# Patient Record
Sex: Male | Born: 1935 | Race: White | Hispanic: No
Health system: Southern US, Community
[De-identification: ages and names within clinical notes are randomized; demographics above are authoritative.]

---

## 2009-09-30 ENCOUNTER — Inpatient Hospital Stay (HOSPITAL_COMMUNITY): Admission: EM | Admit: 2009-09-30 | Discharge: 2009-10-12 | Payer: Self-pay | Admitting: Emergency Medicine

## 2009-10-03 ENCOUNTER — Encounter (INDEPENDENT_AMBULATORY_CARE_PROVIDER_SITE_OTHER): Payer: Self-pay | Admitting: Internal Medicine

## 2009-10-05 ENCOUNTER — Encounter: Payer: Self-pay | Admitting: Cardiology

## 2009-10-17 ENCOUNTER — Inpatient Hospital Stay (HOSPITAL_COMMUNITY): Admission: EM | Admit: 2009-10-17 | Discharge: 2009-10-23 | Payer: Self-pay | Admitting: Emergency Medicine

## 2009-10-17 ENCOUNTER — Ambulatory Visit: Payer: Self-pay | Admitting: Thoracic Surgery (Cardiothoracic Vascular Surgery)

## 2009-10-27 ENCOUNTER — Ambulatory Visit: Payer: Self-pay | Admitting: Thoracic Surgery (Cardiothoracic Vascular Surgery)

## 2009-11-13 ENCOUNTER — Encounter
Admission: RE | Admit: 2009-11-13 | Discharge: 2009-11-13 | Payer: Self-pay | Admitting: Thoracic Surgery (Cardiothoracic Vascular Surgery)

## 2009-11-13 ENCOUNTER — Ambulatory Visit: Payer: Self-pay | Admitting: Thoracic Surgery (Cardiothoracic Vascular Surgery)

## 2010-06-16 DEATH — deceased

## 2010-10-07 ENCOUNTER — Encounter: Payer: Self-pay | Admitting: Thoracic Surgery (Cardiothoracic Vascular Surgery)

## 2010-12-02 LAB — BASIC METABOLIC PANEL
CO2: 25 mEq/L (ref 19–32)
Chloride: 102 mEq/L (ref 96–112)
Creatinine, Ser: 1.32 mg/dL (ref 0.4–1.5)
GFR calc Af Amer: >60 mL/min (ref >60)
Potassium: 3.3 mEq/L — ABNORMAL LOW (ref 3.5–5.1)

## 2010-12-02 LAB — CARDIAC PANEL(CRET KIN+CKTOT+MB+TROPI)
CK, MB: 6.2 ng/mL (ref 0.3–4.0)
CK, MB: 8 ng/mL (ref 0.3–4.0)
Relative Index: 3.4 — ABNORMAL HIGH (ref 0.0–2.5)
Total CK: 226 U/L (ref 7–232)

## 2010-12-02 LAB — DIFFERENTIAL
Basophils Relative: 0 % (ref 0–1)
Eosinophils Absolute: 0.1 10*3/uL (ref 0.0–0.7)
Eosinophils Relative: 1 % (ref 0–5)
Monocytes Absolute: 0.6 10*3/uL (ref 0.1–1.0)
Monocytes Relative: 10 % (ref 3–12)
Neutrophils Relative %: 70 % (ref 43–77)

## 2010-12-02 LAB — URINALYSIS, ROUTINE W REFLEX MICROSCOPIC
Glucose, UA: NEGATIVE mg/dL
Hgb urine dipstick: NEGATIVE
Ketones, ur: NEGATIVE mg/dL
Protein, ur: NEGATIVE mg/dL

## 2010-12-02 LAB — CBC
HCT: 42 % (ref 39.0–52.0)
MCHC: 33.9 g/dL (ref 30.0–36.0)
MCV: 96.5 fL (ref 78.0–100.0)
RBC: 4.35 MIL/uL (ref 4.22–5.81)

## 2010-12-02 LAB — POCT CARDIAC MARKERS

## 2010-12-03 LAB — BASIC METABOLIC PANEL
BUN: 17 mg/dL (ref 6–23)
BUN: 21 mg/dL (ref 6–23)
BUN: 26 mg/dL — ABNORMAL HIGH (ref 6–23)
BUN: 14 mg/dL (ref 6–23)
BUN: 14 mg/dL (ref 6–23)
BUN: 15 mg/dL (ref 6–23)
BUN: 19 mg/dL (ref 6–23)
CO2: 28 mEq/L (ref 19–32)
CO2: 28 mEq/L (ref 19–32)
CO2: 26 mEq/L (ref 19–32)
CO2: 29 mEq/L (ref 19–32)
Calcium: 8.7 mg/dL (ref 8.4–10.5)
Calcium: 9 mg/dL (ref 8.4–10.5)
Calcium: 8.7 mg/dL (ref 8.4–10.5)
Calcium: 8.9 mg/dL (ref 8.4–10.5)
Calcium: 8.9 mg/dL (ref 8.4–10.5)
Calcium: 9.5 mg/dL (ref 8.4–10.5)
Chloride: 103 mEq/L (ref 96–112)
Chloride: 102 mEq/L (ref 96–112)
Chloride: 105 mEq/L (ref 96–112)
Chloride: 109 mEq/L (ref 96–112)
Creatinine, Ser: 1.19 mg/dL (ref 0.4–1.5)
Creatinine, Ser: 0.79 mg/dL (ref 0.4–1.5)
Creatinine, Ser: 0.84 mg/dL (ref 0.4–1.5)
Creatinine, Ser: 0.98 mg/dL (ref 0.4–1.5)
Creatinine, Ser: 1.01 mg/dL (ref 0.4–1.5)
Creatinine, Ser: 1.09 mg/dL (ref 0.4–1.5)
Creatinine, Ser: 1.12 mg/dL (ref 0.4–1.5)
GFR calc Af Amer: >60 mL/min (ref >60)
GFR calc Af Amer: >60 mL/min (ref >60)
GFR calc Af Amer: >60 mL/min (ref >60)
GFR calc Af Amer: >60 mL/min (ref >60)
GFR calc non Af Amer: 42 mL/min — ABNORMAL LOW (ref >60)
GFR calc non Af Amer: 58 mL/min — ABNORMAL LOW (ref >60)
GFR calc non Af Amer: >60 mL/min (ref >60)
GFR calc non Af Amer: >60 mL/min (ref >60)
GFR calc non Af Amer: >60 mL/min (ref >60)
GFR calc non Af Amer: >60 mL/min (ref >60)
Glucose, Bld: 101 mg/dL — ABNORMAL HIGH (ref 70–99)
Glucose, Bld: 101 mg/dL — ABNORMAL HIGH (ref 70–99)
Glucose, Bld: 137 mg/dL — ABNORMAL HIGH (ref 70–99)
Glucose, Bld: 116 mg/dL — ABNORMAL HIGH (ref 70–99)
Glucose, Bld: 120 mg/dL — ABNORMAL HIGH (ref 70–99)
Potassium: 3.7 mEq/L (ref 3.5–5.1)
Potassium: 3.5 mEq/L (ref 3.5–5.1)
Potassium: 3.6 mEq/L (ref 3.5–5.1)
Sodium: 140 mEq/L (ref 135–145)
Sodium: 147 mEq/L — ABNORMAL HIGH (ref 135–145)

## 2010-12-03 LAB — HEPATIC FUNCTION PANEL
ALT: 11 U/L (ref 0–53)
AST: 23 U/L (ref 0–37)
Albumin: 3.2 g/dL — ABNORMAL LOW (ref 3.5–5.2)
Bilirubin, Direct: 0.2 mg/dL (ref 0.0–0.3)
Total Bilirubin: 1.1 mg/dL (ref 0.3–1.2)

## 2010-12-03 LAB — CBC
HCT: 34.6 % — ABNORMAL LOW (ref 39.0–52.0)
HCT: 35 % — ABNORMAL LOW (ref 39.0–52.0)
MCHC: 33.8 g/dL (ref 30.0–36.0)
MCHC: 34.3 g/dL (ref 30.0–36.0)
MCHC: 34.8 g/dL (ref 30.0–36.0)
MCHC: 35 g/dL (ref 30.0–36.0)
MCV: 97.3 fL (ref 78.0–100.0)
MCV: 96.9 fL (ref 78.0–100.0)
MCV: 97.8 fL (ref 78.0–100.0)
Platelets: 117 10*3/uL — ABNORMAL LOW (ref 150–400)
Platelets: 116 10*3/uL — ABNORMAL LOW (ref 150–400)
Platelets: 118 10*3/uL — ABNORMAL LOW (ref 150–400)
Platelets: 200 10*3/uL (ref 150–400)
RBC: 3.91 MIL/uL — ABNORMAL LOW (ref 4.22–5.81)
RDW: 13.1 % (ref 11.5–15.5)
RDW: 12.7 % (ref 11.5–15.5)
RDW: 12.9 % (ref 11.5–15.5)
WBC: 6.7 10*3/uL (ref 4.0–10.5)
WBC: 6.8 10*3/uL (ref 4.0–10.5)
WBC: 9.1 10*3/uL (ref 4.0–10.5)

## 2010-12-03 LAB — URINALYSIS, ROUTINE W REFLEX MICROSCOPIC
Bilirubin Urine: NEGATIVE
Glucose, UA: NEGATIVE mg/dL
Specific Gravity, Urine: 1.02 (ref 1.005–1.030)

## 2010-12-03 LAB — PROTIME-INR: INR: 1.09 (ref 0.00–1.49)

## 2010-12-03 LAB — URINE MICROSCOPIC-ADD ON

## 2010-12-03 LAB — EYE CULTURE

## 2010-12-03 LAB — URINE CULTURE

## 2010-12-03 LAB — CARDIAC PANEL(CRET KIN+CKTOT+MB+TROPI): Relative Index: 2.9 — ABNORMAL HIGH (ref 0.0–2.5)

## 2010-12-03 LAB — MAGNESIUM: Magnesium: 2.2 mg/dL (ref 1.5–2.5)

## 2010-12-03 LAB — CORTISOL-AM, BLOOD: Cortisol - AM: 18.2 ug/dL (ref 4.3–22.4)

## 2010-12-03 LAB — APTT: aPTT: 35 seconds (ref 24–37)

## 2010-12-06 LAB — URINALYSIS, ROUTINE W REFLEX MICROSCOPIC
Ketones, ur: 15 mg/dL — AB
Protein, ur: 30 mg/dL — AB
Urobilinogen, UA: 2 mg/dL — ABNORMAL HIGH (ref 0.0–1.0)

## 2010-12-06 LAB — BASIC METABOLIC PANEL
BUN: 17 mg/dL (ref 6–23)
CO2: 25 mEq/L (ref 19–32)
CO2: 26 mEq/L (ref 19–32)
CO2: 27 mEq/L (ref 19–32)
Calcium: 8 mg/dL — ABNORMAL LOW (ref 8.4–10.5)
Calcium: 8.2 mg/dL — ABNORMAL LOW (ref 8.4–10.5)
Calcium: 9.4 mg/dL (ref 8.4–10.5)
Chloride: 105 mEq/L (ref 96–112)
Chloride: 107 mEq/L (ref 96–112)
Creatinine, Ser: 1.03 mg/dL (ref 0.4–1.5)
Creatinine, Ser: 2.07 mg/dL — ABNORMAL HIGH (ref 0.4–1.5)
Creatinine, Ser: 2.37 mg/dL — ABNORMAL HIGH (ref 0.4–1.5)
GFR calc Af Amer: 32 mL/min — ABNORMAL LOW (ref >60)
GFR calc Af Amer: 37 mL/min — ABNORMAL LOW (ref >60)
GFR calc Af Amer: 45 mL/min — ABNORMAL LOW (ref >60)
GFR calc Af Amer: >60 mL/min (ref >60)
GFR calc non Af Amer: 37 mL/min — ABNORMAL LOW (ref >60)
GFR calc non Af Amer: 58 mL/min — ABNORMAL LOW (ref >60)
Glucose, Bld: 110 mg/dL — ABNORMAL HIGH (ref 70–99)
Glucose, Bld: 123 mg/dL — ABNORMAL HIGH (ref 70–99)
Glucose, Bld: 86 mg/dL (ref 70–99)
Potassium: 3.8 mEq/L (ref 3.5–5.1)
Potassium: 4.4 mEq/L (ref 3.5–5.1)
Sodium: 138 mEq/L (ref 135–145)
Sodium: 147 mEq/L — ABNORMAL HIGH (ref 135–145)

## 2010-12-06 LAB — CBC
HCT: 41.9 % (ref 39.0–52.0)
HCT: 42.6 % (ref 39.0–52.0)
Hemoglobin: 12.3 g/dL — ABNORMAL LOW (ref 13.0–17.0)
Hemoglobin: 13.8 g/dL (ref 13.0–17.0)
MCHC: 33.4 g/dL (ref 30.0–36.0)
MCHC: 33.6 g/dL (ref 30.0–36.0)
MCV: 96.8 fL (ref 78.0–100.0)
RBC: 3.77 MIL/uL — ABNORMAL LOW (ref 4.22–5.81)
RBC: 3.82 MIL/uL — ABNORMAL LOW (ref 4.22–5.81)
RBC: 4.27 MIL/uL (ref 4.22–5.81)
RBC: 4.38 MIL/uL (ref 4.22–5.81)
RBC: 4.4 MIL/uL (ref 4.22–5.81)
RDW: 13.8 % (ref 11.5–15.5)
WBC: 13.7 10*3/uL — ABNORMAL HIGH (ref 4.0–10.5)
WBC: 18.3 10*3/uL — ABNORMAL HIGH (ref 4.0–10.5)
WBC: 9.8 10*3/uL (ref 4.0–10.5)

## 2010-12-06 LAB — COMPREHENSIVE METABOLIC PANEL
ALT: 13 U/L (ref 0–53)
Alkaline Phosphatase: 104 U/L (ref 39–117)
BUN: 70 mg/dL — ABNORMAL HIGH (ref 6–23)
Chloride: 115 mEq/L — ABNORMAL HIGH (ref 96–112)
Glucose, Bld: 164 mg/dL — ABNORMAL HIGH (ref 70–99)
Potassium: 2.9 mEq/L — ABNORMAL LOW (ref 3.5–5.1)
Total Bilirubin: 1.1 mg/dL (ref 0.3–1.2)

## 2010-12-06 LAB — DIFFERENTIAL
Eosinophils Relative: 0 % (ref 0–5)
Lymphocytes Relative: 5 % — ABNORMAL LOW (ref 12–46)
Monocytes Absolute: 1.6 10*3/uL — ABNORMAL HIGH (ref 0.1–1.0)
Monocytes Relative: 9 % (ref 3–12)
Neutrophils Relative %: 86 % — ABNORMAL HIGH (ref 43–77)

## 2010-12-06 LAB — CULTURE, BLOOD (ROUTINE X 2)

## 2010-12-06 LAB — URINE MICROSCOPIC-ADD ON

## 2011-01-29 NOTE — Assessment & Plan Note (Signed)
OFFICE VISIT   Frank Jackson, Frank Jackson  DOB:  March 03, 1922                                        November 13, 2009  CHART #:  [id number]   HISTORY:  The patient comes in today for post hospital follow up.  He is  status post a left-sided chest tube for spontaneous pneumothorax on  October 17, 2009.  Since his discharge, he has been doing well.  He was  discharged to the [facility] and has been progressing  well.  He denies any chest discomfort or shortness of breath.  He is  currently under the care of Orthopedics for a left humerus fracture as  well as Cardiology for a recent pacemaker placement.  He has no specific  complaints today.  According to the caregiver who accompanies him to the  office today, he is progressing very well with rehab modalities at the  nursing center.   PHYSICAL EXAMINATION:  Vital Signs:  Blood pressure is 119/64, pulse is  70, respirations 18, O2 sat 95%.  His left chest tube site has healed  well.  His chest tube sutures were removed on previous visit with the  nurse.  There is no erythema or drainage at the chest tube sites.  Heart  is regular rate and rhythm without murmurs, rubs, or gallops.  Lungs are  clear to auscultation.  Chest x-ray shows complete resolution of the  left pneumothorax.   IMPRESSION AND PLAN:  The patient is doing well status post left chest  tube for spontaneous pneumothorax.  We will plan on seeing him back as  needed.   Salvatore Decent Dorris Fetch, M.D.  Electronically Signed   GC/MEDQ  D:  11/13/2009  T:  11/14/2009  Job:  [id number]   cc:   Baltazar Najjar, MD

## 2011-05-24 IMAGING — CT CT HEAD W/O CM
1 of 2 series · 16 of 30 positions shown, 20 images · non-contrast
Comparison: None

CLINICAL DATA: Fall.

CT HEAD WITHOUT CONTRAST
TECHNIQUE: Contiguous axial images were obtained from the base of
the skull through the vertex without contrast.

[Series 3: headseq 2.4 h60s · axial · 0.43mm/px · z∈[-169,-17]mm · 16 of 72 slices shown, 20 images]
[im 4/72  brain]
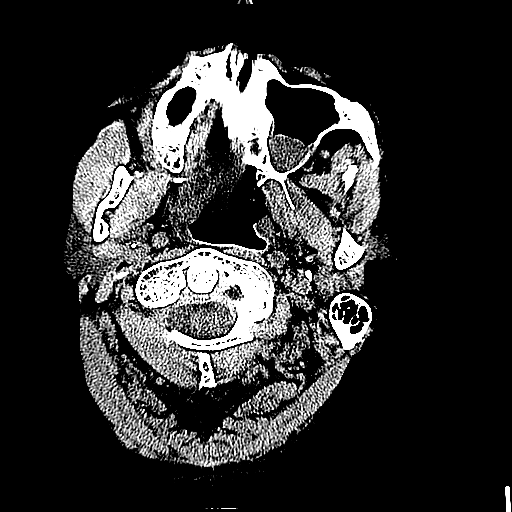
[im 4/72  bone]
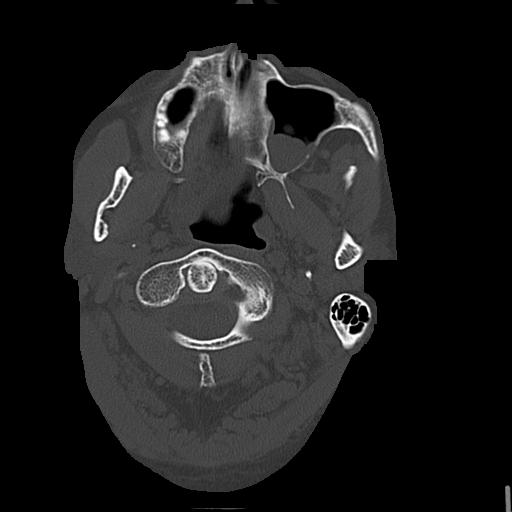
[im 8/72  brain]
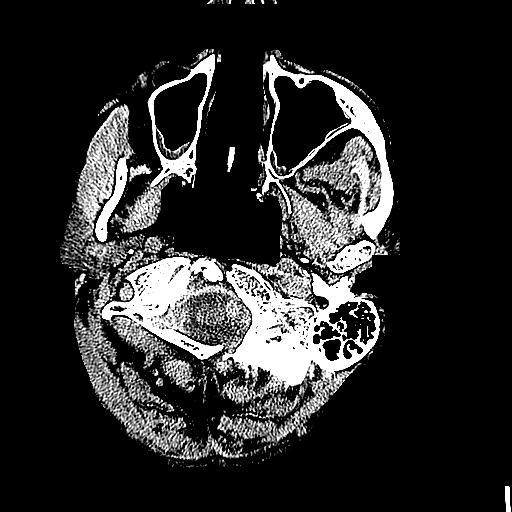
[im 12/72  brain]
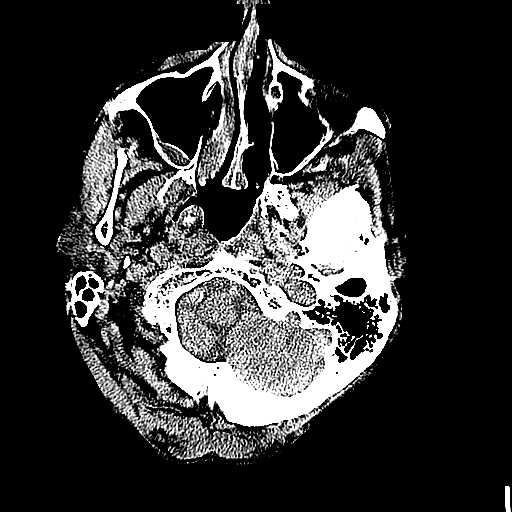
[im 15/72  brain]
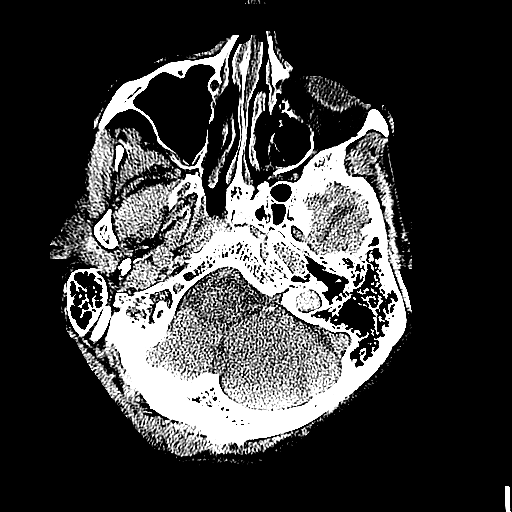
[im 23/72  brain]
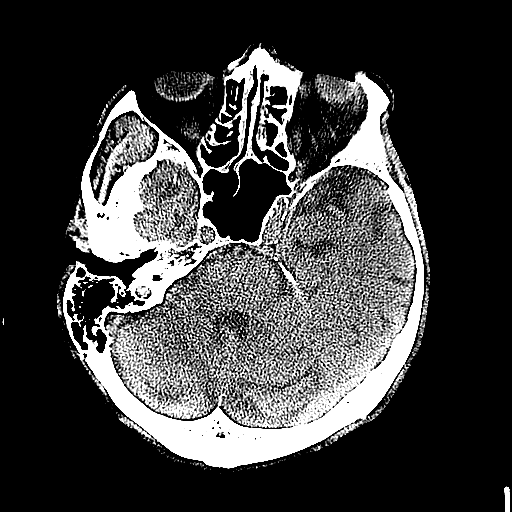
[im 23/72  bone]
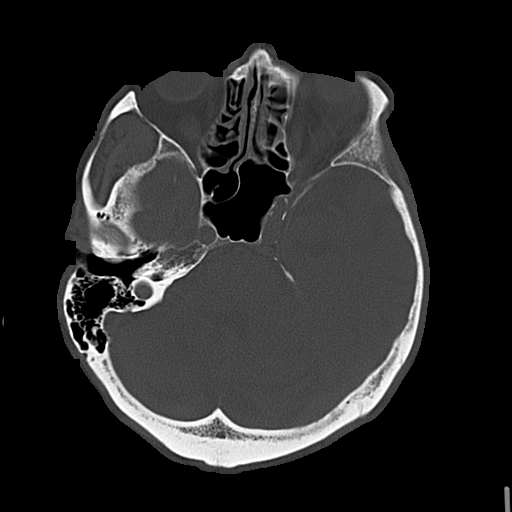
[im 27/72  brain]
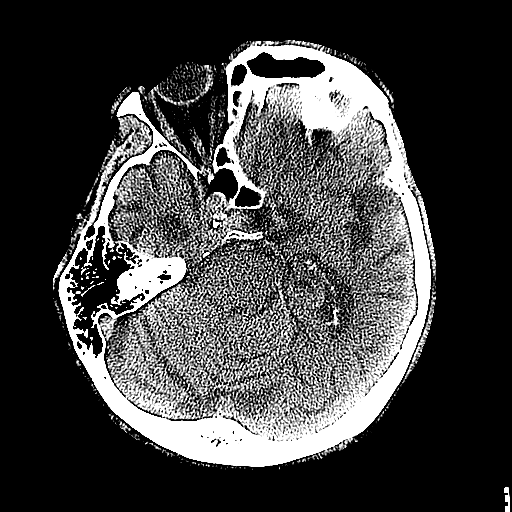
[im 30/72  brain]
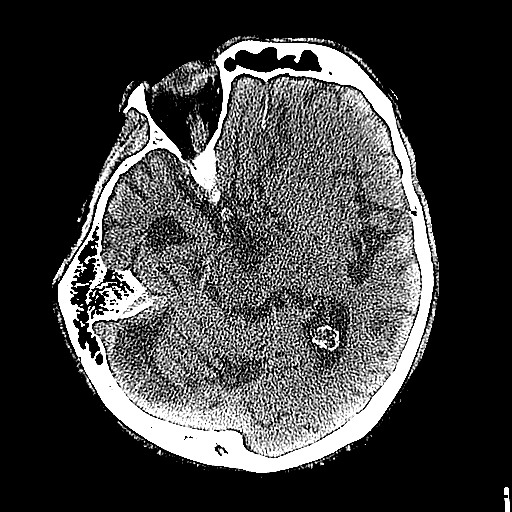
[im 34/72  brain]
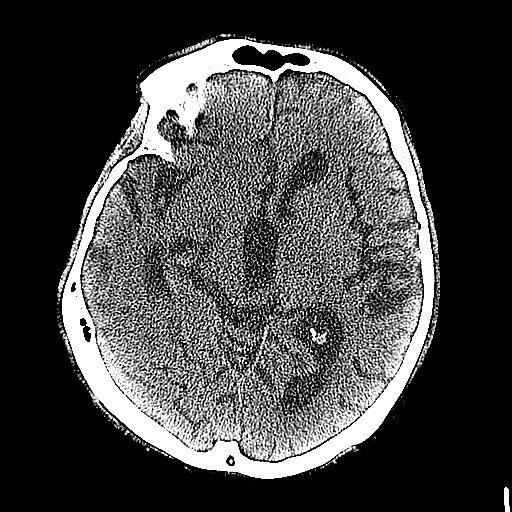
[im 38/72  brain]
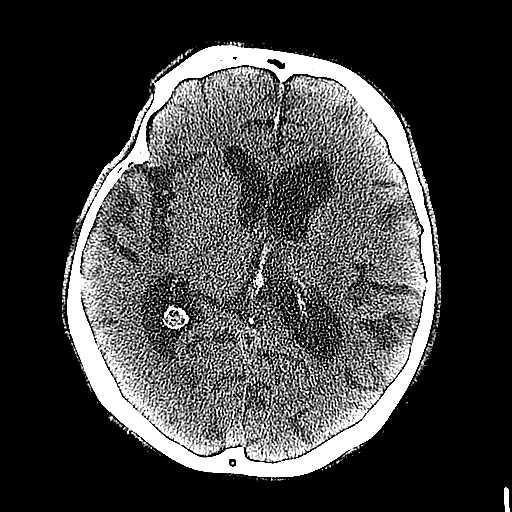
[im 38/72  bone]
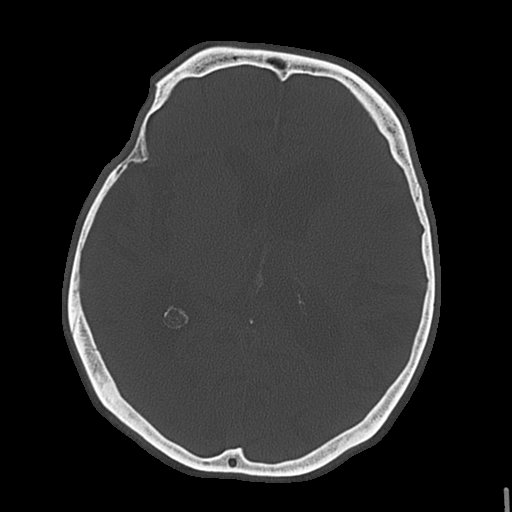
[im 42/72  brain]
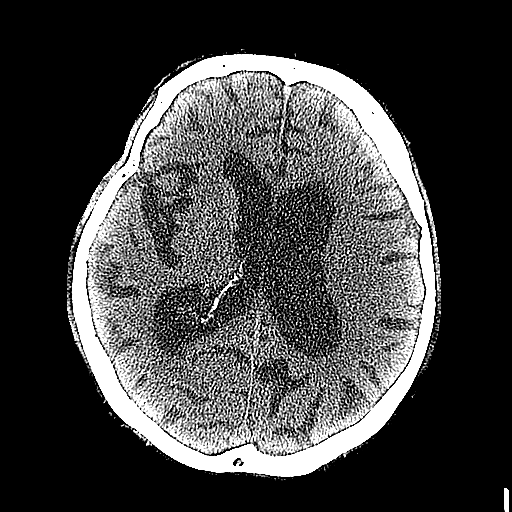
[im 45/72  brain]
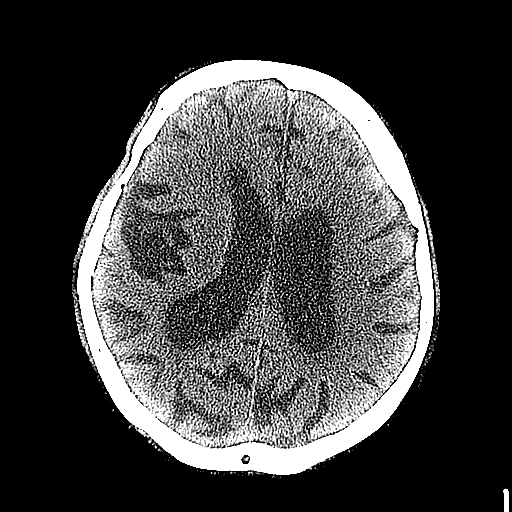
[im 49/72  brain]
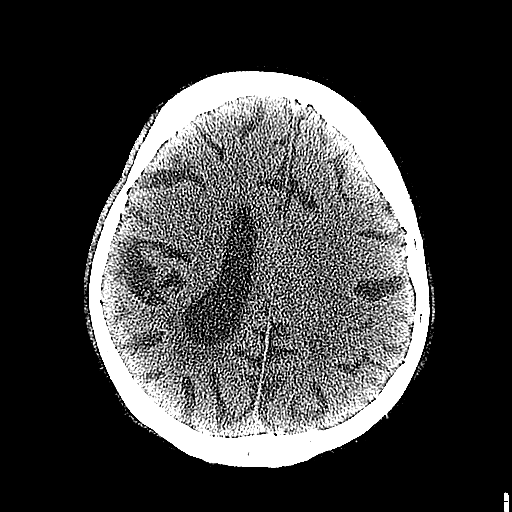
[im 57/72  brain]
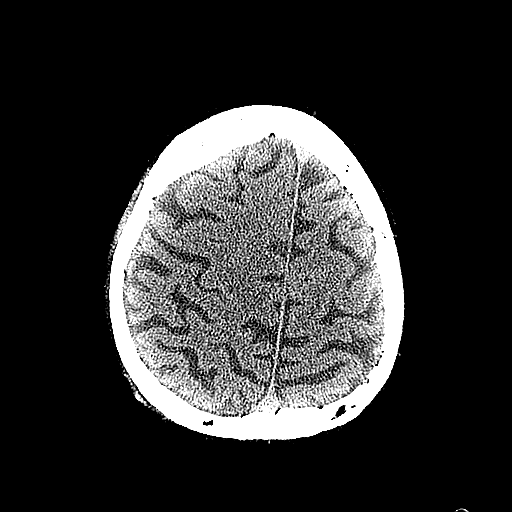
[im 57/72  bone]
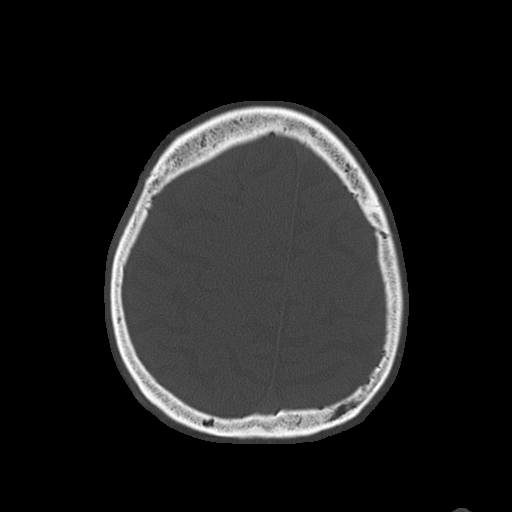
[im 60/72  brain]
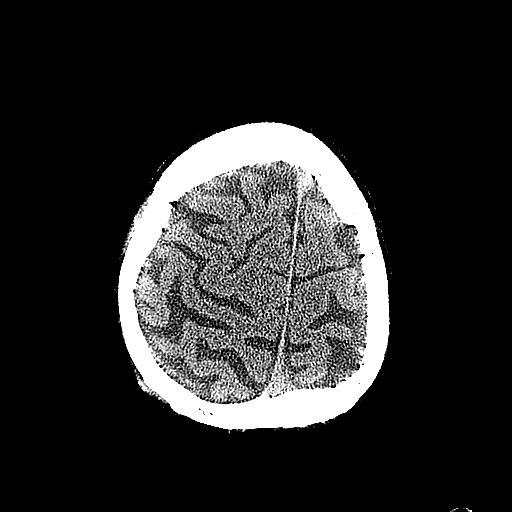
[im 64/72  brain]
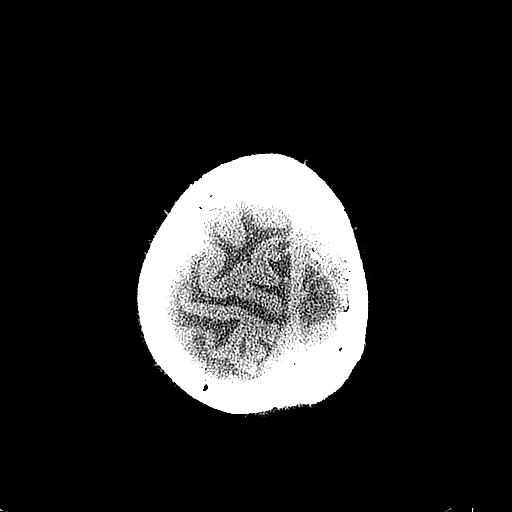
[im 68/72  brain]
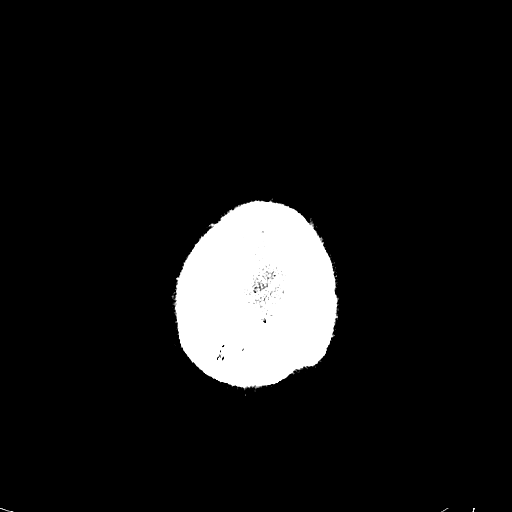

[16 of 30 positions shown; findings below may reference images not displayed]

FINDINGS: The brain demonstrates advanced cortical atrophy and
moderate small vessel disease.  There is no evidence of acute
hemorrhage, extra-axial fluid collection, mass effect,
hydrocephalus, mass lesion or visible infarction.  The skull is
intact.
IMPRESSION: No acute findings.  Advanced cortical atrophy and associated small
vessel disease.

## 2011-05-29 IMAGING — CR DG CHEST 1V PORT
1 series · 1 of 1 positions shown · non-contrast
Comparison: Portable chest x-ray of 09/30/2009

CLINICAL DATA: Post pacemaker insertion, history of dementia

PORTABLE CHEST - 1 VIEW

[AP]
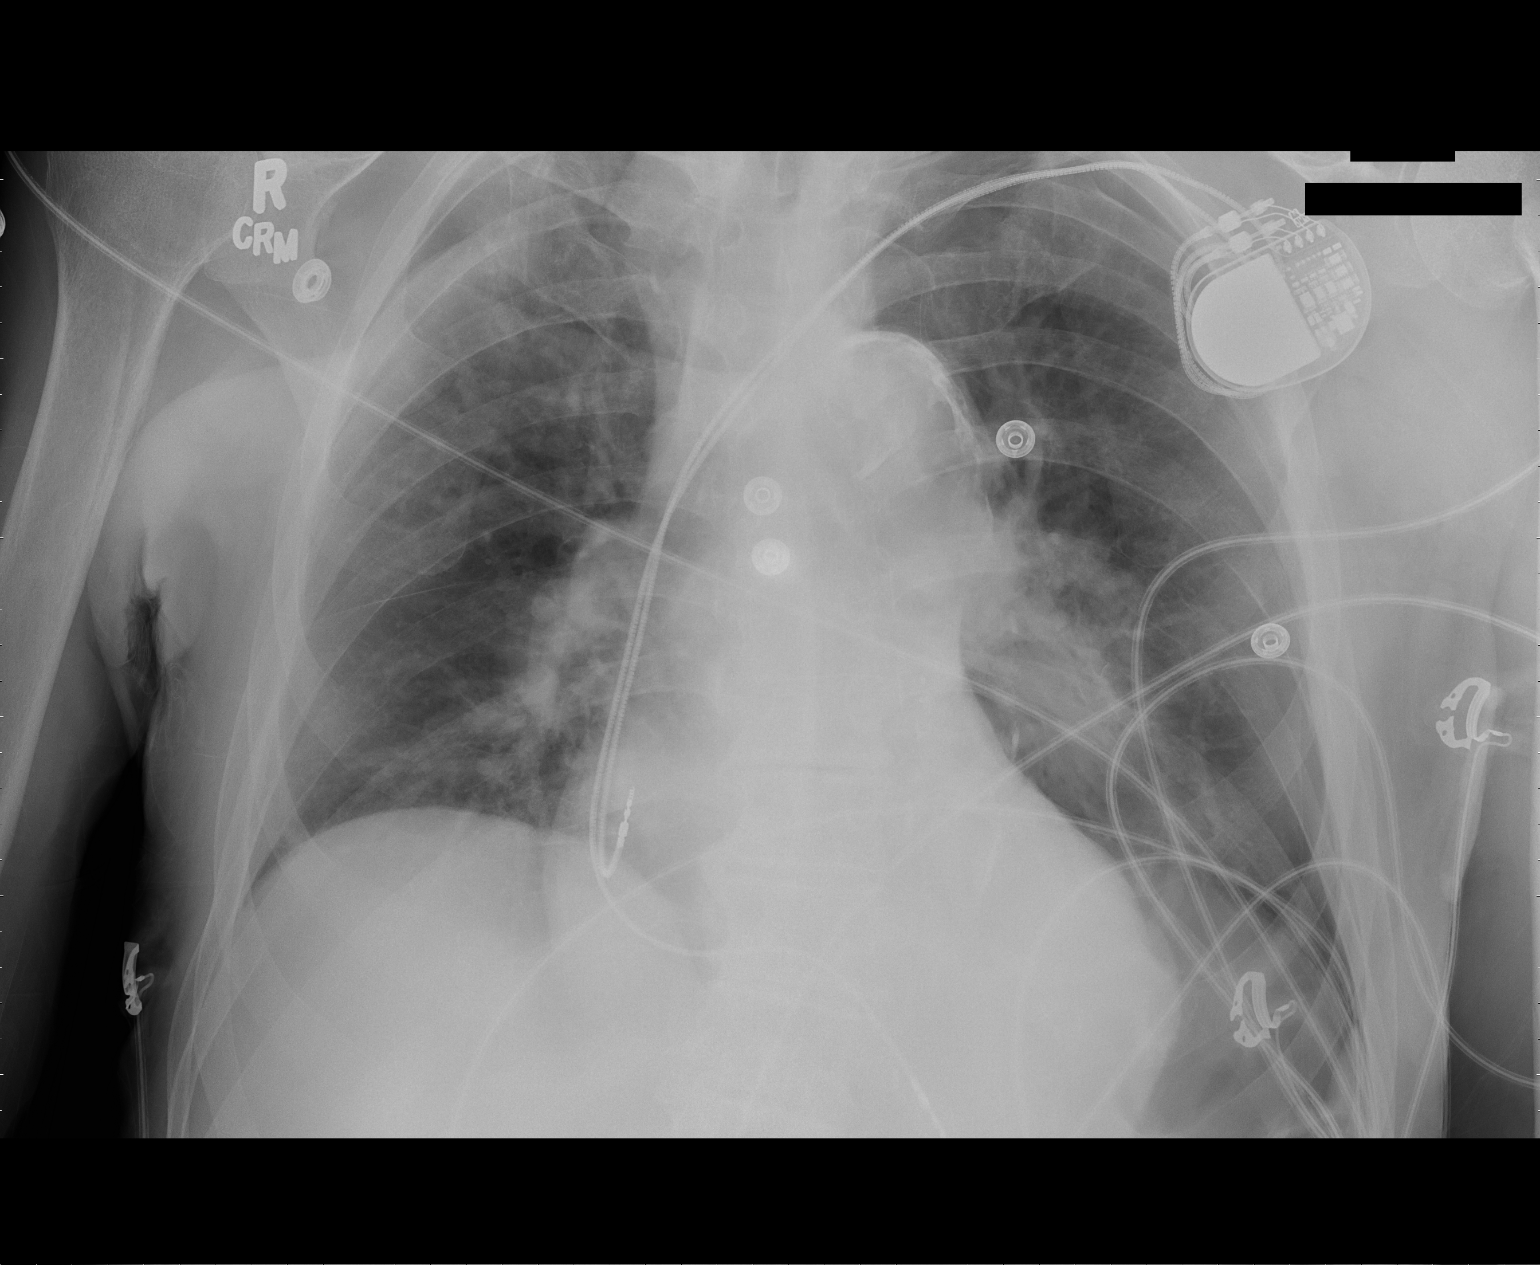

[1 of 1 positions shown; findings below may reference images not displayed]

FINDINGS: A permanent pacemaker is now present in good position.
No pneumothorax is seen.  Mild basilar atelectasis is present and
cardiomegaly is stable.
IMPRESSION: Dual lead permanent pacemaker is now present.  No pneumothorax.
Slightly diminished aeration.
